# Patient Record
Sex: Female | Born: 1975 | Race: Black or African American | Hispanic: No | Marital: Single | Smoking: Never smoker
Health system: Southern US, Community
[De-identification: ages and names within clinical notes are randomized; demographics above are authoritative.]

## PROBLEM LIST (undated history)

## (undated) HISTORY — PX: GASTRIC BYPASS: SHX52

---

## 2011-05-28 ENCOUNTER — Ambulatory Visit: Payer: Self-pay | Admitting: Bariatrics

## 2011-07-01 ENCOUNTER — Other Ambulatory Visit: Payer: Self-pay | Admitting: Bariatrics

## 2011-07-01 ENCOUNTER — Ambulatory Visit: Payer: Self-pay | Admitting: Bariatrics

## 2012-08-12 IMAGING — CR DG CHEST 2V
1 series · 2 of 2 positions shown · non-contrast
Comparison: none

REASON FOR EXAM: [DATE] Obesity
COMMENTS:

PROCEDURE:     DXR - DXR CHEST PA (OR AP) AND LATERAL  - May 28, 2011 [DATE]
RESULT:     The lung fields are clear. No pneumonia, pneumothorax or pleural
effusion is seen. Heart size is normal. The mediastinal and osseous
structures show no significant abnormalities.

[Series 1: view not recorded · 0.17mm/px · 2 of 2 slices shown]
[im 1/2]
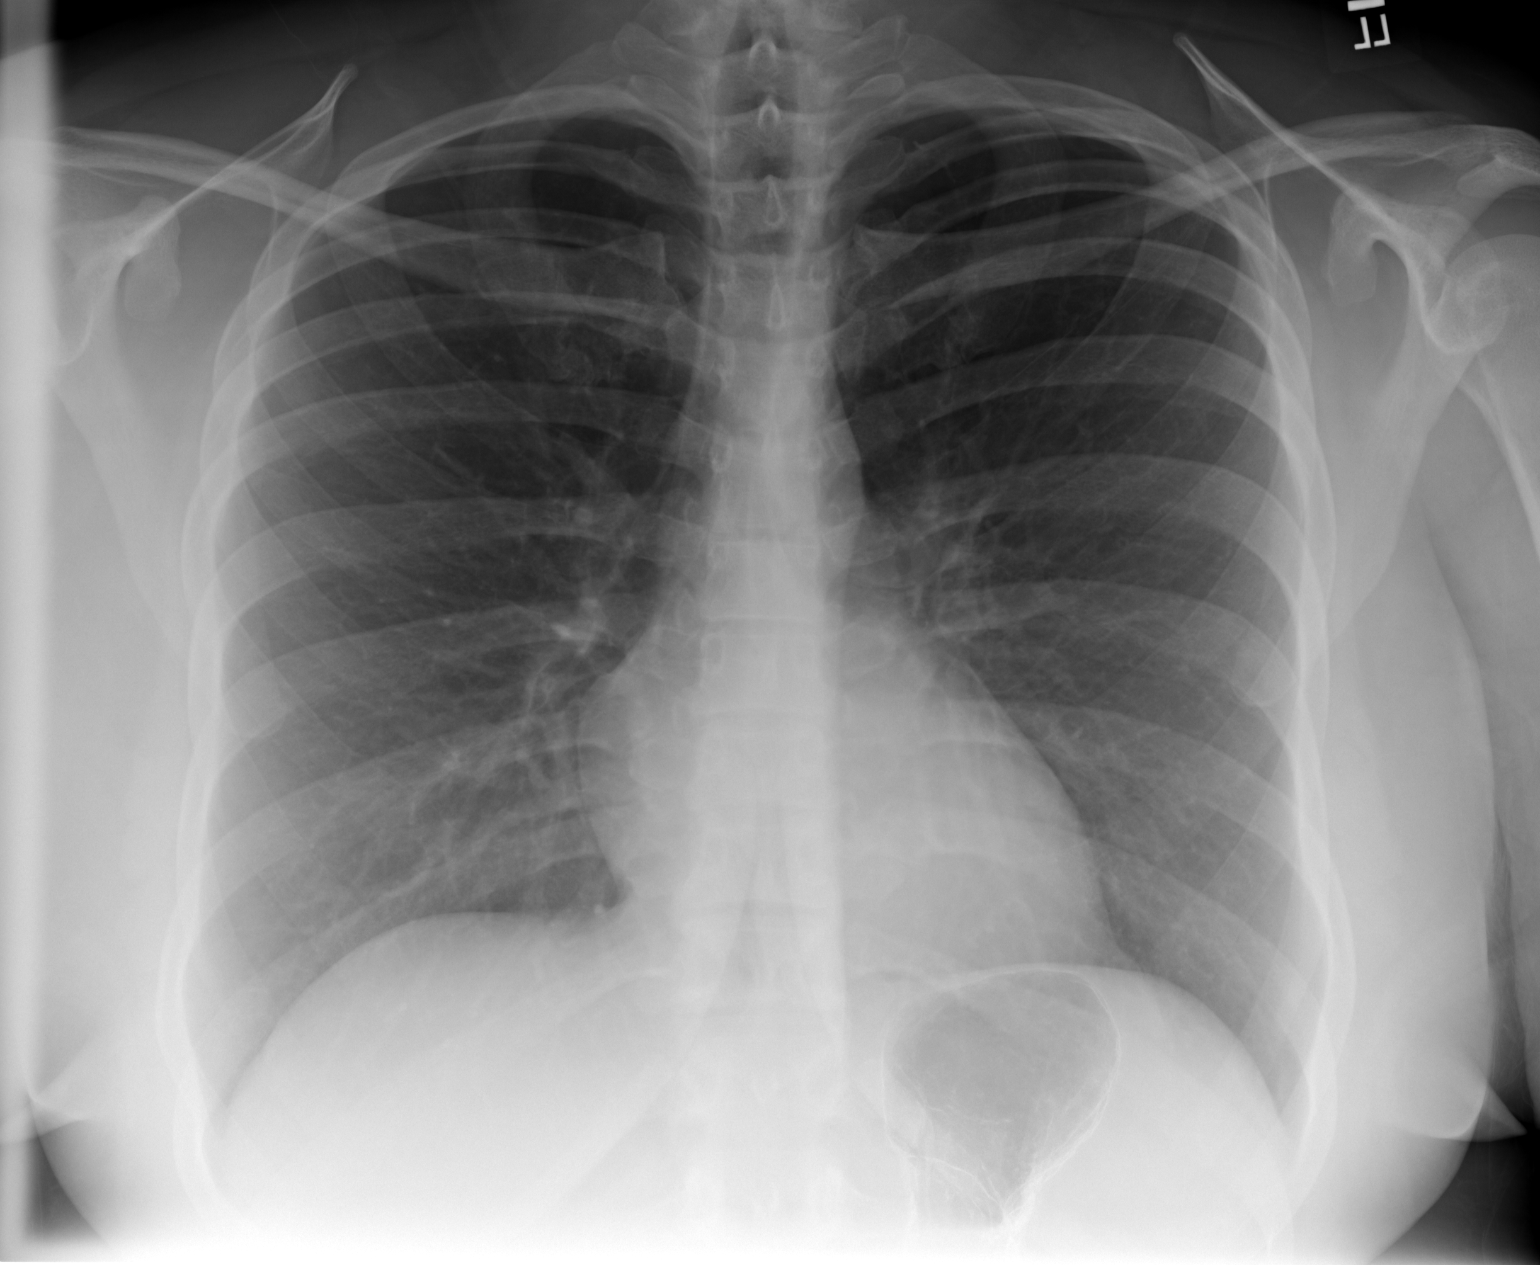
[im 2/2]
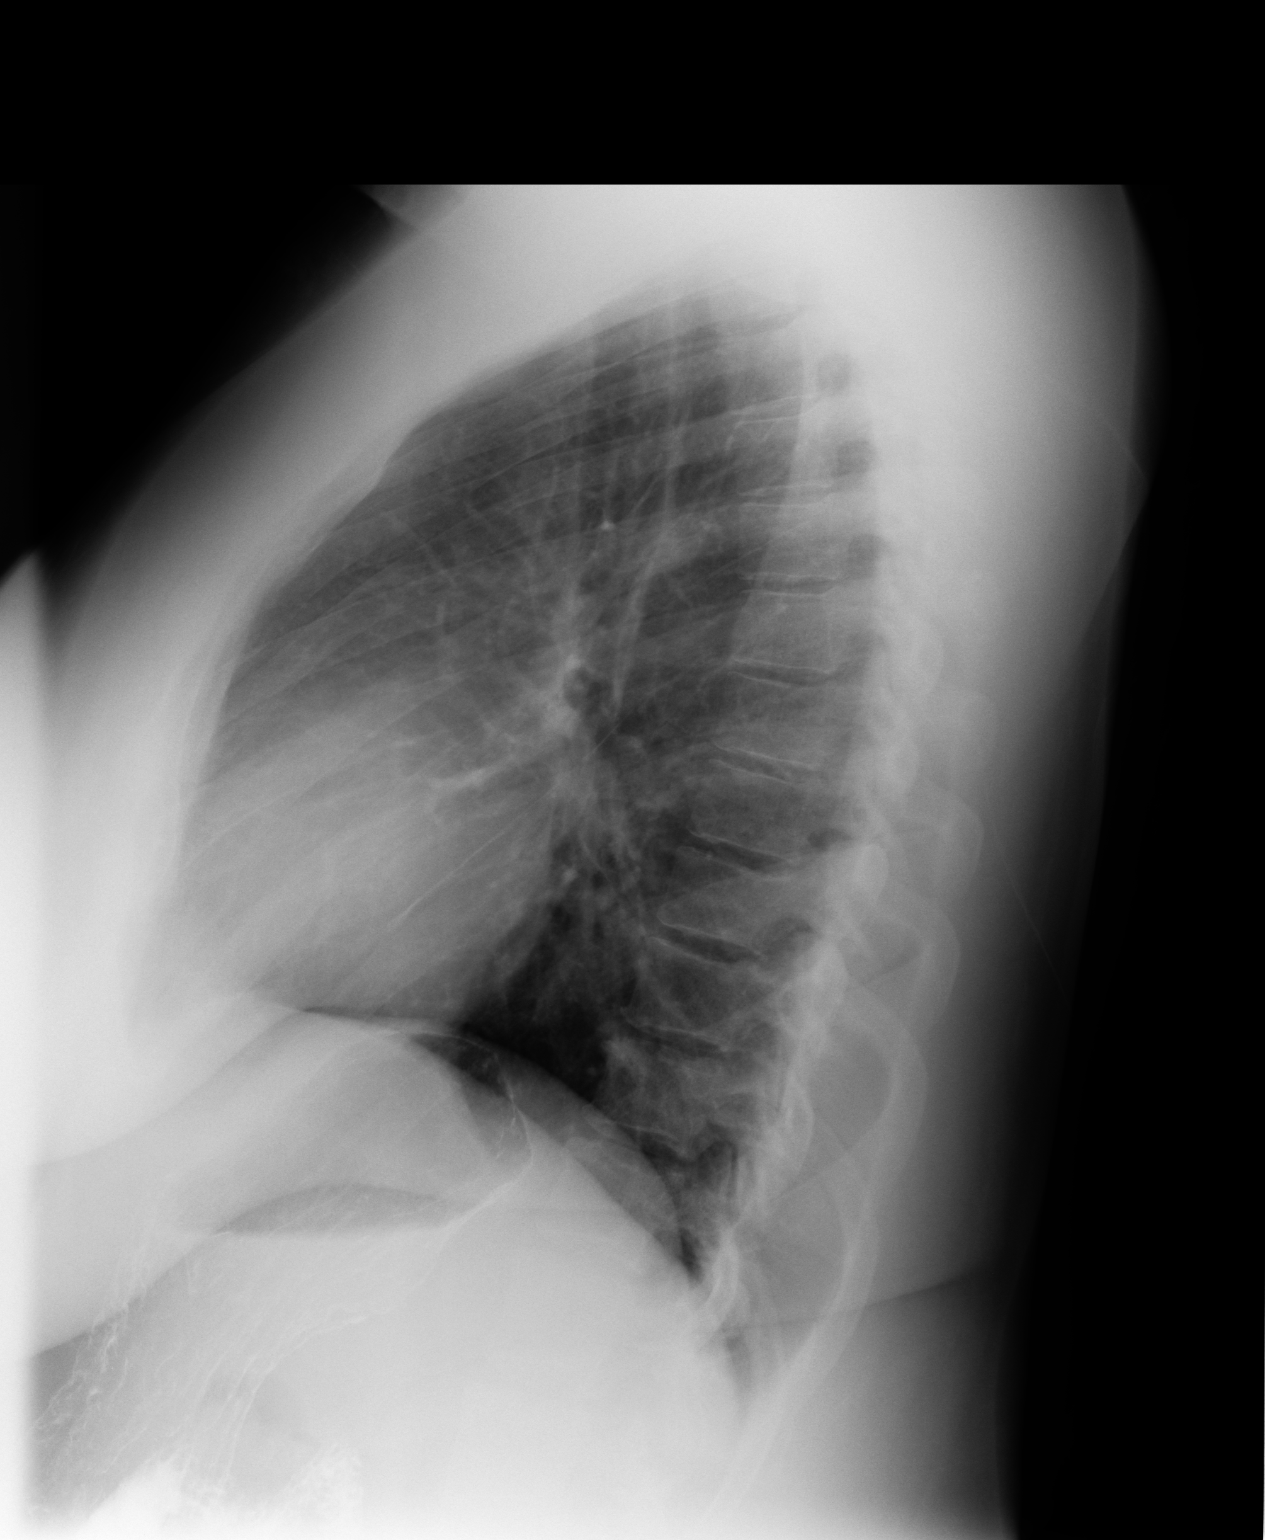

[2 of 2 positions shown; findings below may reference images not displayed]

IMPRESSION: 1.     No acute changes are identified.

## 2012-09-15 IMAGING — CT CT CHEST W/ CM
1 of 2 series · 14 of 30 positions shown, 18 images · non-contrast
Comparison: none

post op surg on  Taniya ...
COMMENTS:

PROCEDURE:     KCT - KCT CHEST (FOR PE) W CONTRAST  - July 01, 2011 [DATE]
RESULT:     History: Chest pain shortness of breath.
Comparison Study: Chest x-ray 05/28/2011.

[Series 6: pe_lg 3.0 i41s 3 · axial · 0.71mm/px · z∈[-348,-99]mm · 14 of 97 slices shown, 18 images]
[im 7/97  mediastinal]
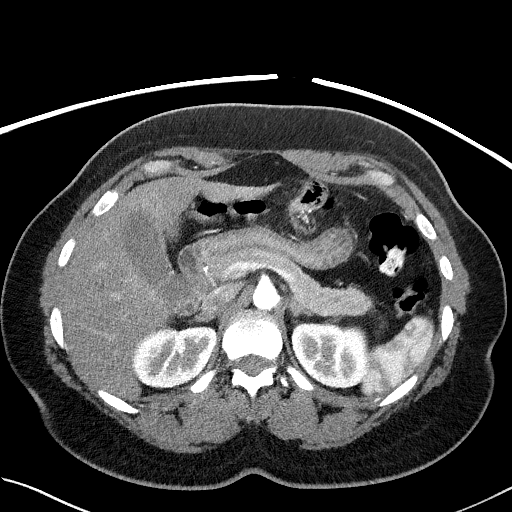
[im 7/97  lung]
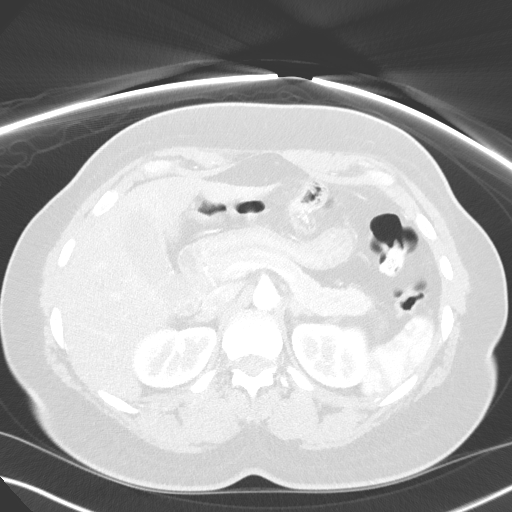
[im 14/97  lung]
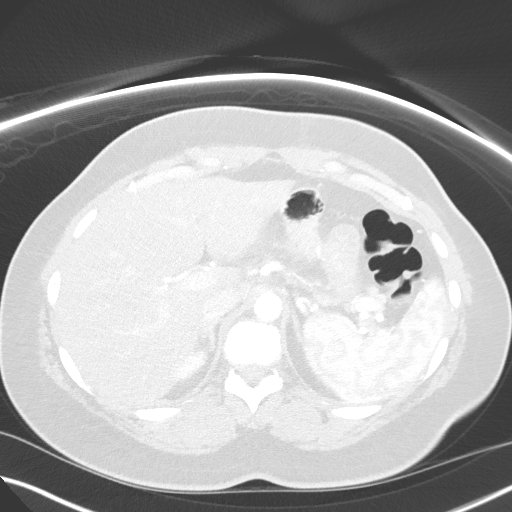
[im 21/97  lung]
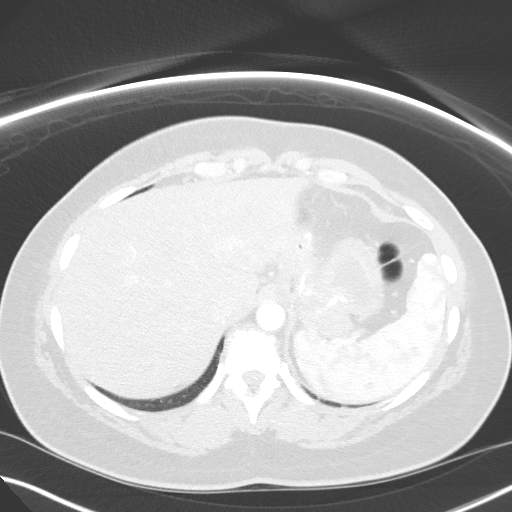
[im 28/97  lung]
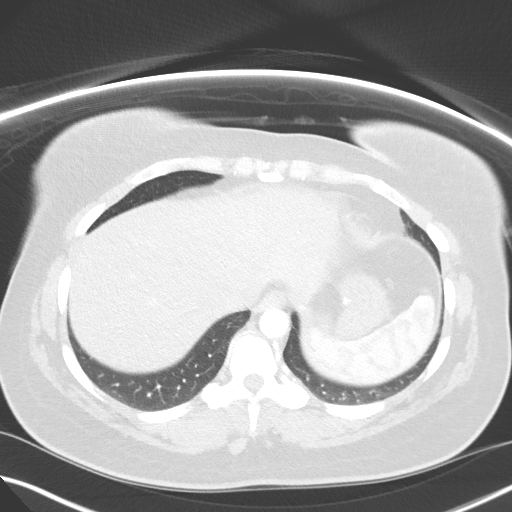
[im 35/97  mediastinal]
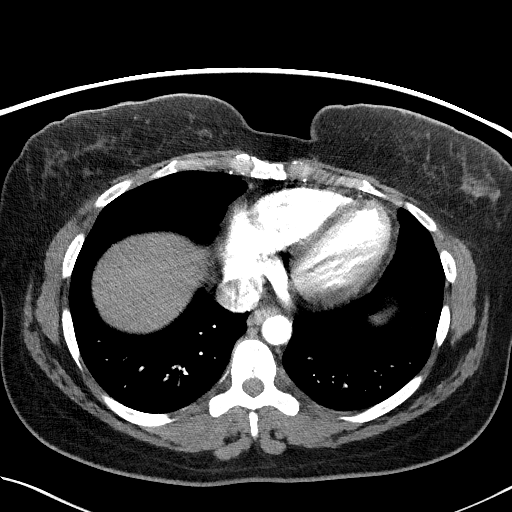
[im 35/97  lung]
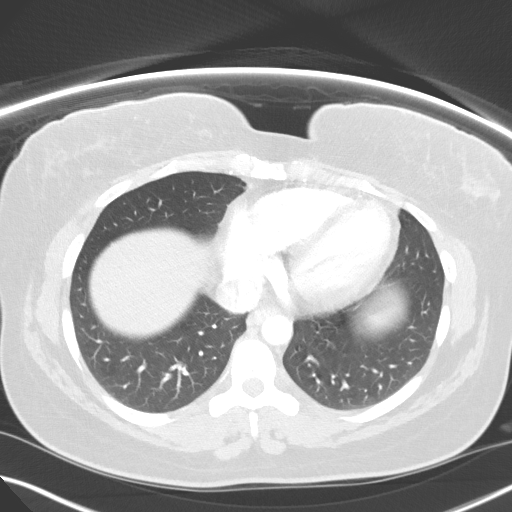
[im 42/97  lung]
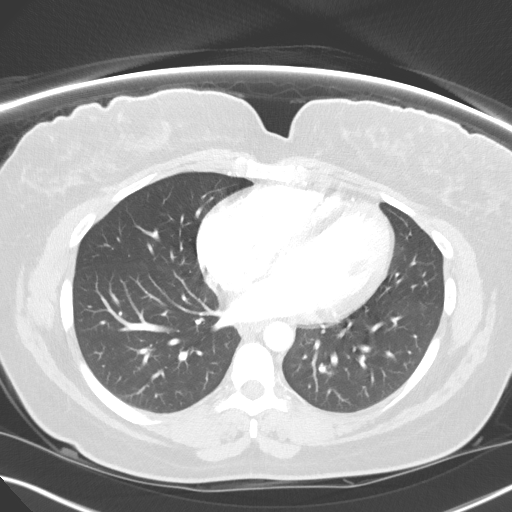
[im 46/97  lung]
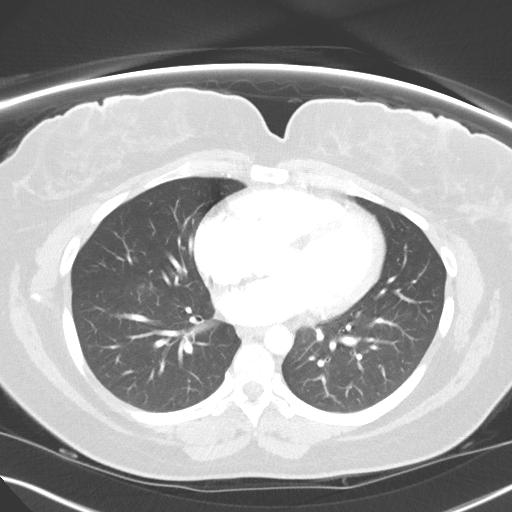
[im 49/97  lung]
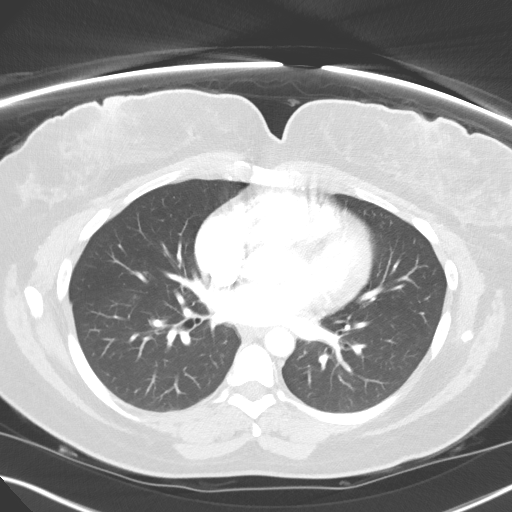
[im 55/97  mediastinal]
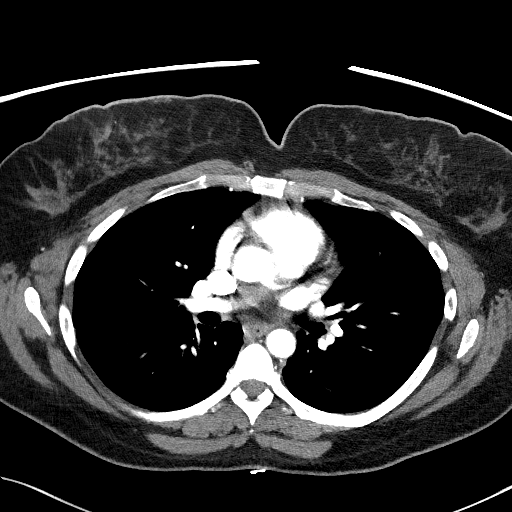
[im 55/97  lung]
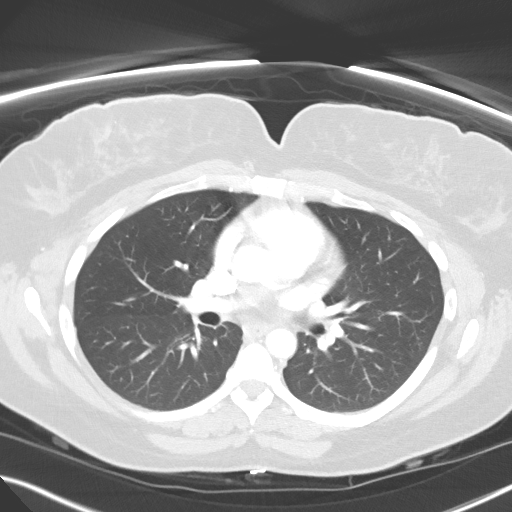
[im 62/97  lung]
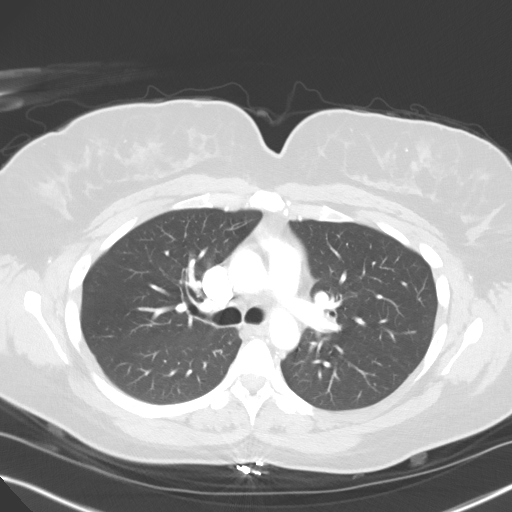
[im 69/97  lung]
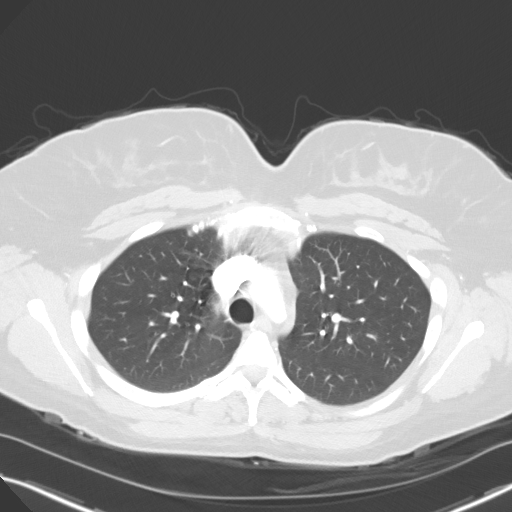
[im 76/97  lung]
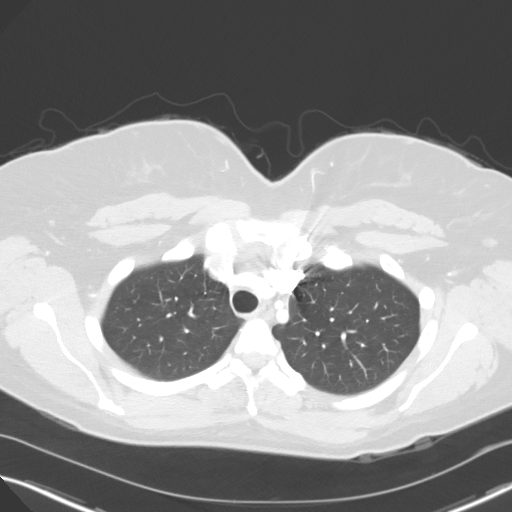
[im 83/97  mediastinal]
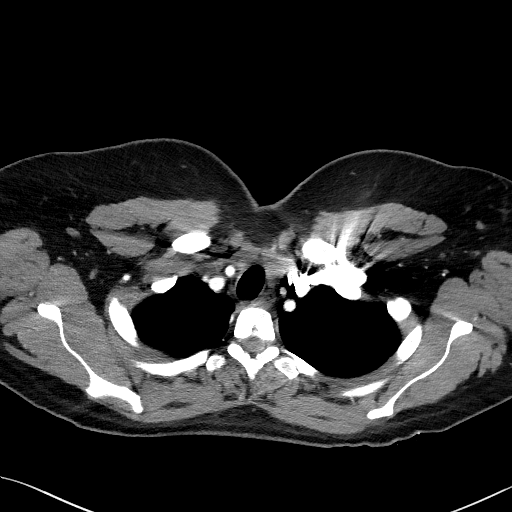
[im 83/97  lung]
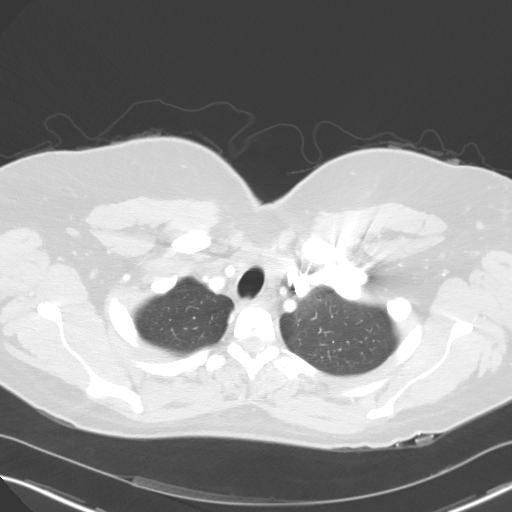
[im 90/97  lung]
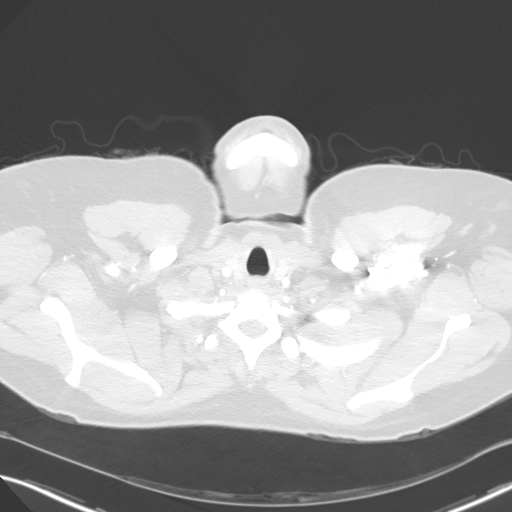

[14 of 30 positions shown; findings below may reference images not displayed]

FINDINGS: Standard CT of the chest obtained with the 100 cc of Dsovue-30Z.
Pulmonary arteries are normal. Large airways are patent. Adrenals are normal.
IMPRESSION: No acute abnormality.

## 2018-12-22 ENCOUNTER — Encounter: Payer: Self-pay | Admitting: Gynecology

## 2018-12-22 ENCOUNTER — Other Ambulatory Visit: Payer: Self-pay

## 2018-12-22 ENCOUNTER — Ambulatory Visit
Admission: EM | Admit: 2018-12-22 | Discharge: 2018-12-22 | Disposition: A | Discharge status: Home / Self Care | Payer: BC Managed Care – PPO | Attending: Family Medicine | Admitting: Family Medicine

## 2018-12-22 DIAGNOSIS — J029 Acute pharyngitis, unspecified: Secondary | ICD-10-CM | POA: Diagnosis not present

## 2018-12-22 LAB — RAPID STREP SCREEN (MED CTR MEBANE ONLY): STREPTOCOCCUS, GROUP A SCREEN (DIRECT): NEGATIVE

## 2018-12-22 MED ORDER — LIDOCAINE VISCOUS HCL 2 % MT SOLN: OROMUCOSAL | 0 refills | Status: AC

## 2018-12-22 NOTE — ED Triage Notes (Signed)
Patient c/o swollen tonsil. Per patient not a sore throat.

## 2018-12-22 NOTE — Discharge Instructions (Addendum)
This is viral.  Medication as directed.  Ibuprofen as needed.  Take care  Dr. Adriana Simas

## 2018-12-23 NOTE — ED Provider Notes (Signed)
[facility]    CSN: [id number] Arrival date & time: 12/22/18  1601  History   Chief Complaint Sore throat  HPI  43 year old female presents with sore throat.  Patient reports a 2-week history of sore throat.  Worsening as of today.  Patient states that the area feels swollen.  No fever.  No other respiratory symptoms.  No known exacerbating relieving factors.  No other complaints or concerns at this time.  PMH, Surgical Hx, Family Hx, Social History reviewed and updated as below.  PMH: S/P bariatric surgery, Carpal tunnel syndrome, IDA  Past Surgical History:  Procedure Laterality Date  . CESAREAN SECTION    . GASTRIC BYPASS      OB History   No obstetric history on file.    Home Medications    Prior to Admission medications   Medication Sig Start Date End Date Taking? Authorizing Provider  Calcium-Magnesium-Zinc [phone] MG TABS Take by mouth.   Yes [provider]  ferrous sulfate (FER-IN-SOL) 75 (15 Fe) MG/ML SOLN Take by mouth.   Yes [provider]  levonorgestrel (MIRENA) 20 MCG/24HR IUD by Intrauterine route. 11/22/17  Yes [provider]  lidocaine (XYLOCAINE) 2 % solution Gargle 15 mL every 3 hours as needed. May swallow if desired. 12/22/18   Tommie Sams, DO    Family History Family History  Problem Relation Age of Onset  . Diabetes Mother   . Hypertension Mother   . Heart failure Father     Social History Social History   Tobacco Use  . Smoking status: Never Smoker  . Smokeless tobacco: Never Used  Substance Use Topics  . Alcohol use: Yes  . Drug use: Never     Allergies   Penicillins; Pineapple; Pollen extract; and Lactose   Review of Systems Review of Systems  Constitutional: Negative.   HENT: Positive for sore throat.    Physical Exam Triage Vital Signs ED Triage Vitals  Enc Vitals Group     BP 12/22/18 1622 111/84     Pulse Rate 12/22/18 1622 65     Resp 12/22/18 1622 16     Temp 12/22/18  1622 98.2 F (36.8 C)     Temp Source 12/22/18 1622 Oral     SpO2 12/22/18 1622 100 %     Weight 12/22/18 1623 190 lb (86.2 kg)     Height 12/22/18 1623 5\' 8"  (1.727 m)     Head Circumference --      Peak Flow --      Pain Score 12/22/18 1620 0     Pain Loc --      Pain Edu? --      Excl. in GC? --    Updated Vital Signs BP 111/84 (BP Location: Right Arm)   Pulse 65   Temp 98.2 F (36.8 C) (Oral)   Resp 16   Ht 5\' 8"  (1.727 m)   Wt 86.2 kg   LMP 11/21/2018   SpO2 100%   BMI 28.89 kg/m   Visual Acuity Right Eye Distance:   Left Eye Distance:   Bilateral Distance:    Right Eye Near:   Left Eye Near:    Bilateral Near:     Physical Exam Vitals signs and nursing note reviewed.  Constitutional:      General: She is not in acute distress. HENT:     Head: Normocephalic and atraumatic.     Nose: Nose normal.     Mouth/Throat:  Comments: Oropharynx with mild erythema Eyes:     General:        Right eye: No discharge.        Left eye: No discharge.     Conjunctiva/sclera: Conjunctivae normal.  Cardiovascular:     Rate and Rhythm: Normal rate and regular rhythm.  Pulmonary:     Effort: Pulmonary effort is normal.     Breath sounds: No wheezing, rhonchi or rales.  Neurological:     Mental Status: She is alert.  Psychiatric:        Behavior: Behavior normal.     Comments: Flat affect.    UC Treatments / Results  Labs (all labs ordered are listed, but only abnormal results are displayed) Labs Reviewed  RAPID STREP SCREEN ([facility] ONLY)  CULTURE, GROUP A STREP [facility])    EKG None  Radiology No results found.  Procedures Procedures (including critical care time)  Medications Ordered in UC Medications - No data to display  Initial Impression / Assessment and Plan / UC Course  I have reviewed the triage vital signs and the nursing notes.  Pertinent labs & imaging results that were available during my care of the patient were reviewed by me  and considered in my medical decision making (see chart for details).    43 year old female presents with viral pharyngitis.  Viscous lidocaine as needed.  Supportive care.  Final Clinical Impressions(s) / UC Diagnoses   Final diagnoses:  Viral pharyngitis     Discharge Instructions     This is viral.  Medication as directed.  Ibuprofen as needed.  Take care  Dr. Adriana Simasook    ED Prescriptions    Medication Sig Dispense Auth. Provider   lidocaine (XYLOCAINE) 2 % solution Gargle 15 mL every 3 hours as needed. May swallow if desired. 200 mL Tommie Samsook, Haunani Dickard G, DO     Controlled Substance Prescriptions [place] Controlled Substance Registry consulted? Not Applicable   Tommie SamsCook, Kennis Wissmann G, DO 12/23/18 1601

## 2018-12-25 LAB — CULTURE, GROUP A STREP (THRC)
